# Patient Record
Sex: Male | Born: 1990 | Race: White | Hispanic: No | Marital: Single | Smoking: Never smoker
Health system: Southern US, Community
[De-identification: ages and names within clinical notes are randomized; demographics above are authoritative.]

## PROBLEM LIST (undated history)

## (undated) DIAGNOSIS — M4300 Spondylolysis, site unspecified: Secondary | ICD-10-CM

## (undated) HISTORY — PX: WISDOM TOOTH EXTRACTION: SHX21

## (undated) HISTORY — DX: Spondylolysis, site unspecified: M43.00

---
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
## 2009-08-26 ENCOUNTER — Encounter: Admission: RE | Admit: 2009-08-26 | Discharge: 2009-08-26 | Payer: Self-pay | Admitting: Family Medicine

## 2010-04-25 ENCOUNTER — Encounter: Admission: RE | Admit: 2010-04-25 | Discharge: 2010-04-25 | Payer: Self-pay | Admitting: Family Medicine

## 2011-02-26 ENCOUNTER — Other Ambulatory Visit: Payer: Self-pay | Admitting: Family Medicine

## 2011-02-26 ENCOUNTER — Ambulatory Visit
Admission: RE | Admit: 2011-02-26 | Discharge: 2011-02-26 | Disposition: A | Discharge status: Home / Self Care | Payer: 59 | Source: Ambulatory Visit | Attending: Family Medicine | Admitting: Family Medicine

## 2011-02-26 DIAGNOSIS — M545 Low back pain: Secondary | ICD-10-CM

## 2011-03-05 ENCOUNTER — Ambulatory Visit: Payer: 59 | Attending: Family Medicine

## 2011-03-05 DIAGNOSIS — M545 Low back pain, unspecified: Secondary | ICD-10-CM | POA: Insufficient documentation

## 2011-03-05 DIAGNOSIS — IMO0001 Reserved for inherently not codable concepts without codable children: Secondary | ICD-10-CM | POA: Insufficient documentation

## 2011-03-05 DIAGNOSIS — R5381 Other malaise: Secondary | ICD-10-CM | POA: Insufficient documentation

## 2011-03-05 DIAGNOSIS — M546 Pain in thoracic spine: Secondary | ICD-10-CM | POA: Insufficient documentation

## 2011-03-10 ENCOUNTER — Ambulatory Visit: Payer: 59 | Admitting: Physical Therapy

## 2011-03-12 ENCOUNTER — Ambulatory Visit: Payer: 59 | Admitting: Physical Therapy

## 2011-03-17 ENCOUNTER — Ambulatory Visit: Payer: 59 | Attending: Family Medicine | Admitting: Physical Therapy

## 2011-03-17 DIAGNOSIS — R5381 Other malaise: Secondary | ICD-10-CM | POA: Insufficient documentation

## 2011-03-17 DIAGNOSIS — M545 Low back pain, unspecified: Secondary | ICD-10-CM | POA: Insufficient documentation

## 2011-03-17 DIAGNOSIS — M546 Pain in thoracic spine: Secondary | ICD-10-CM | POA: Insufficient documentation

## 2011-03-17 DIAGNOSIS — IMO0001 Reserved for inherently not codable concepts without codable children: Secondary | ICD-10-CM | POA: Insufficient documentation

## 2011-03-19 ENCOUNTER — Ambulatory Visit: Payer: 59

## 2011-03-24 ENCOUNTER — Encounter: Payer: 59 | Admitting: Physical Therapy

## 2011-03-26 ENCOUNTER — Encounter: Payer: 59 | Admitting: Physical Therapy

## 2011-03-31 ENCOUNTER — Encounter: Payer: 59 | Admitting: Physical Therapy

## 2011-04-02 ENCOUNTER — Encounter: Payer: 59 | Admitting: Physical Therapy

## 2013-06-12 ENCOUNTER — Ambulatory Visit
Admission: RE | Admit: 2013-06-12 | Discharge: 2013-06-12 | Disposition: A | Discharge status: Home / Self Care | Payer: 59 | Source: Ambulatory Visit | Attending: Family Medicine | Admitting: Family Medicine

## 2013-06-12 ENCOUNTER — Other Ambulatory Visit: Payer: Self-pay | Admitting: Family Medicine

## 2013-06-12 DIAGNOSIS — T1490XA Injury, unspecified, initial encounter: Secondary | ICD-10-CM

## 2013-06-12 DIAGNOSIS — M25561 Pain in right knee: Secondary | ICD-10-CM

## 2013-06-29 ENCOUNTER — Ambulatory Visit: Payer: 59 | Attending: Family Medicine

## 2013-06-29 DIAGNOSIS — R262 Difficulty in walking, not elsewhere classified: Secondary | ICD-10-CM | POA: Insufficient documentation

## 2013-06-29 DIAGNOSIS — IMO0001 Reserved for inherently not codable concepts without codable children: Secondary | ICD-10-CM | POA: Insufficient documentation

## 2013-06-29 DIAGNOSIS — R5381 Other malaise: Secondary | ICD-10-CM | POA: Insufficient documentation

## 2013-06-29 DIAGNOSIS — M25569 Pain in unspecified knee: Secondary | ICD-10-CM | POA: Insufficient documentation

## 2013-07-04 ENCOUNTER — Ambulatory Visit: Payer: 59

## 2013-07-06 ENCOUNTER — Ambulatory Visit: Payer: 59 | Admitting: Physical Therapy

## 2013-07-12 ENCOUNTER — Ambulatory Visit: Payer: 59 | Admitting: Physical Therapy

## 2014-01-04 ENCOUNTER — Encounter: Payer: Self-pay | Admitting: Neurology

## 2014-01-08 ENCOUNTER — Encounter: Payer: Self-pay | Admitting: Neurology

## 2014-01-08 ENCOUNTER — Ambulatory Visit (INDEPENDENT_AMBULATORY_CARE_PROVIDER_SITE_OTHER): Payer: 59 | Admitting: Neurology

## 2014-01-08 ENCOUNTER — Telehealth: Payer: Self-pay | Admitting: Neurology

## 2014-01-08 VITALS — BP 131/83 | HR 85 | Ht 74.0 in | Wt 164.0 lb

## 2014-01-08 DIAGNOSIS — M79609 Pain in unspecified limb: Secondary | ICD-10-CM | POA: Insufficient documentation

## 2014-01-08 DIAGNOSIS — M79643 Pain in unspecified hand: Secondary | ICD-10-CM

## 2014-01-08 NOTE — Telephone Encounter (Signed)
I received the report from gross per orthopedics concerning the MRI of the cervical spine. This shows a small disc protrusions at C3-4 level without cord compression or nerve root compression. This may be a source of the neck pain, I would doubt that this has anything to do with his hand pain. Blood work is pending, if this is unremarkable, we will pursue EMG and nerve conduction study evaluation.

## 2014-01-08 NOTE — Progress Notes (Signed)
Reason for visit: Hand pain  Nicolas Patton is a 23 y.o. male  History of present illness:  Nicolas Patton is a 23 year old right-handed white male with a history of onset of wrist discomfort that began in February 2015. The patient initially noted onset of left wrist pain in the ulnar distribution, spreading to the base of the thumb on the left, and eventually to the right side. The patient indicates that the discomfort occurs after working all day. The patient has been placed in a brace, given a trial on prednisone, and placed on diclofenac without benefit. The patient mainly has some discomfort with extension of the thumb. The patient denies any radiation of pain to the elbow or shoulder, but within the last 6 weeks he has developed some neck discomfort that may radiate into the shoulders. The patient denies any weakness of the arms, and he denies any numbness or tingling sensations. He denies any back pain or leg discomfort or leg numbness or weakness. He denies any balance issues or problems controlling the bowels or the bladder. MRI of the cervical spine has been done, the results of this study are not available to me. The patient himself is unaware of the results. He is sent to this office for an evaluation.  Past Medical History  Diagnosis Date  . Pars defect     Past Surgical History  Procedure Laterality Date  . Wisdom tooth extraction      Family History  Problem Relation Age of Onset  . Depression Father   . Alzheimer's disease Maternal Grandmother     Social history:  reports that he has never smoked. He has never used smokeless tobacco. He reports that he drinks alcohol. He reports that he does not use illicit drugs.  Medications:  Current Outpatient Prescriptions on File Prior to Visit  Medication Sig Dispense Refill  . naproxen sodium (ANAPROX) 220 MG tablet Take 220 mg by mouth 2 (two) times daily with a meal.      . diclofenac (VOLTAREN) 75 MG EC tablet Take 75 mg by  mouth 2 (two) times daily.       No current facility-administered medications on file prior to visit.     No Known Allergies  ROS:  Out of a complete 14 system review of symptoms, the patient complains only of the following symptoms, and all other reviewed systems are negative.  Chest pain Joint pain  Blood pressure 131/83, pulse 85, height 6\' 2"  (1.88 m), weight 164 lb (74.39 kg).  Physical Exam  General: The patient is alert and cooperative at the time of the examination.  Eyes: Pupils are equal, round, and reactive to light. Discs are flat bilaterally.  Neck: The neck is supple, no carotid bruits are noted.  Respiratory: The respiratory examination is clear.  Cardiovascular: The cardiovascular examination reveals a regular rate and rhythm, no obvious murmurs or rubs are noted.  Neuromuscular: Range of movement of cervical spine is full.  Skin: Extremities are without significant edema.  Neurologic Exam  Mental status: The patient is alert and oriented x 3 at the time of the examination. The patient has apparent normal recent and remote memory, with an apparently normal attention span and concentration ability.  Cranial nerves: Facial symmetry is present. There is good sensation of the face to pinprick and soft touch bilaterally. The strength of the facial muscles and the muscles to head turning and shoulder shrug are normal bilaterally. Speech is well enunciated, no aphasia or dysarthria  is noted. Extraocular movements are full. Visual fields are full. The tongue is midline, and the patient has symmetric elevation of the soft palate. No obvious hearing deficits are noted.  Motor: The motor testing reveals 5 over 5 strength of all 4 extremities. Good symmetric motor tone is noted throughout.  Sensory: Sensory testing is intact to pinprick, soft touch, vibration sensation, and position sense on all 4 extremities. No evidence of extinction is noted. Tinel sign is negative at  the wrists bilaterally.  Coordination: Cerebellar testing reveals good finger-nose-finger and heel-to-shin bilaterally.  Gait and station: Gait is normal. Tandem gait is normal. Romberg is negative. No drift is seen.  Reflexes: Deep tendon reflexes are symmetric and normal bilaterally. Toes are downgoing bilaterally.   Assessment/Plan:  1. Bilateral wrist discomfort  The history and clinical examination appears to be consistent with a tendinitis issue as originally suspected. The patient has developed some neuromuscular discomfort in the neck. There are no radicular symptoms. The patient will be set up for blood work today looking for a rheumatologic etiology of his symptoms. We may consider nerve conduction studies of both arms, EMG of at least 1 extremity in the future. We will obtain the results of the MRI of the cervical spine that was done.  Marlan Palau MD 01/08/2014 12:48 PM  [facility] [address] [place], [place] [address]  Phone [phone] Fax [phone]

## 2014-01-09 ENCOUNTER — Telehealth: Payer: Self-pay | Admitting: Neurology

## 2014-01-09 DIAGNOSIS — M79643 Pain in unspecified hand: Secondary | ICD-10-CM

## 2014-01-09 LAB — SEDIMENTATION RATE: SED RATE: 2 mm/h (ref 0–15)

## 2014-01-09 LAB — RHEUMATOID FACTOR: Rhuematoid fact SerPl-aCnc: <7 IU/mL (ref 0.0–13.9)

## 2014-01-09 LAB — ANGIOTENSIN CONVERTING ENZYME: ANGIO CONVERT ENZYME: 31 U/L (ref 14–82)

## 2014-01-09 LAB — LYME, TOTAL AB TEST/REFLEX: Lyme IgG/IgM Ab: <0.91 {ISR} (ref 0.00–0.90)

## 2014-01-09 LAB — ANA W/REFLEX: Anti Nuclear Antibody(ANA): NEGATIVE

## 2014-01-09 NOTE — Telephone Encounter (Signed)
Blood work is unremarkable, we will consider nerve conduction studies of the hands, EMG of the right arm.

## 2014-02-02 ENCOUNTER — Encounter (INDEPENDENT_AMBULATORY_CARE_PROVIDER_SITE_OTHER): Payer: Self-pay

## 2014-02-02 ENCOUNTER — Ambulatory Visit (INDEPENDENT_AMBULATORY_CARE_PROVIDER_SITE_OTHER): Payer: 59 | Admitting: Neurology

## 2014-02-02 DIAGNOSIS — Z0289 Encounter for other administrative examinations: Secondary | ICD-10-CM

## 2014-02-02 DIAGNOSIS — M79643 Pain in unspecified hand: Secondary | ICD-10-CM

## 2014-02-02 DIAGNOSIS — M79609 Pain in unspecified limb: Secondary | ICD-10-CM

## 2014-02-02 NOTE — Progress Notes (Signed)
Nicolas Patton is a 23 year old patient with a history of left wrist discomfort beginning in February 2015. The patient has had gradual spreading of pain to the right wrist as well. He does have some problems with neck discomfort at times. He is being evaluated for a neuropathy or a cervical radiculopathy.  Nerve conduction studies done on both arms were normal, without evidence of a neuropathy. EMG evaluation of the right upper extremity shows isolated mild denervation of the extensor indicis proprius muscle on the right. The patient is felt potentially to having mild right posterior interosseous neuropathy versus a possible low-grade thoracic outlet syndrome. The left upper extremity evaluation was normal.  The patient will use a wrist splint on the right over the next 6 weeks, he will followup in the next 3 months, if the pain worsens or weakness ensues, further evaluation will be undertaken.

## 2014-02-02 NOTE — Procedures (Signed)
     HISTORY:  Nicolas Patton is a 23 year old patient with a history of onset of left wrist discomfort that began in February 2015, gradually spreading to the right side as well. The does have some problems with neck discomfort, and a history of a "bulging disc" in the neck. The patient is being evaluated for possible neuropathy or a cervical radiculopathy.  NERVE CONDUCTION STUDIES:  Nerve conduction studies were performed on both upper extremities. The distal motor latencies and motor amplitudes for the median and ulnar nerves were within normal limits. The F wave latencies and nerve conduction velocities for these nerves were also normal. The sensory latencies for the median and ulnar nerves were normal.   EMG STUDIES:  EMG study was performed on the right upper extremity:  The first dorsal interosseous muscle reveals 2 to 4 K units with full recruitment. No fibrillations or positive waves were noted. The abductor pollicis brevis muscle reveals 2 to 4 K units with full recruitment. No fibrillations or positive waves were noted. The extensor indicis proprius muscle reveals 1 to 3 K units with full recruitment. One plus fibrillations and positive waves were noted. The pronator teres muscle reveals 2 to 3 K units with full recruitment. No fibrillations or positive waves were noted. The biceps muscle reveals 1 to 2 K units with full recruitment. No fibrillations or positive waves were noted. The triceps muscle reveals 2 to 4 K units with full recruitment. No fibrillations or positive waves were noted. The anterior deltoid muscle reveals 2 to 3 K units with full recruitment. No fibrillations or positive waves were noted. The cervical paraspinal muscles were tested at 2 levels. No abnormalities of insertional activity were seen at either level tested. There was poor relaxation.  EMG study was performed on the left upper extremity:  The first dorsal interosseous muscle reveals 2 to 4 K units with  full recruitment. No fibrillations or positive waves were noted. The abductor pollicis brevis muscle reveals 2 to 4 K units with full recruitment. No fibrillations or positive waves were noted. The extensor indicis proprius muscle reveals 1 to 3 K units with full recruitment. No fibrillations or positive waves were noted. The pronator teres muscle reveals 2 to 3 K units with full recruitment. No fibrillations or positive waves were noted. The biceps muscle reveals 1 to 2 K units with full recruitment. No fibrillations or positive waves were noted. The triceps muscle reveals 2 to 4 K units with full recruitment. No fibrillations or positive waves were noted. The anterior deltoid muscle reveals 2 to 3 K units with full recruitment. No fibrillations or positive waves were noted. The cervical paraspinal muscles were tested at 2 levels. No abnormalities of insertional activity were seen at either level tested. There was good relaxation.   IMPRESSION:  Nerve conduction studies done on both upper extremities were unremarkable, without evidence of a neuropathy. EMG evaluation of the right upper extremity shows isolated mild denervation of the extensor indicis proprius muscle, which is of unclear clinical significance. It is possible that there may be a mild posterior interosseous neuropathy, or a mild thoracic outlet syndrome, but clinical correlation is required. No clear evidence of an overlying right cervical radiculopathy is seen. EMG of the left upper extremity is unremarkable, without evidence of a cervical radiculopathy.   Marlan Palau. Keith Jameson Morrow MD 02/02/2014 1:45 PM  [facility] [address] [place], [place] [id number]  Phone [phone] Fax [phone]

## 2014-10-17 IMAGING — CR DG KNEE COMPLETE 4+V*R*
4 series · 4 of 4 positions shown · non-contrast
Comparison: None.

CLINICAL DATA: Trauma 6 days ago.  Pain with flexion.

EXAM:
RIGHT KNEE - COMPLETE 4+ VIEW

[view not recorded (1 of 4)]
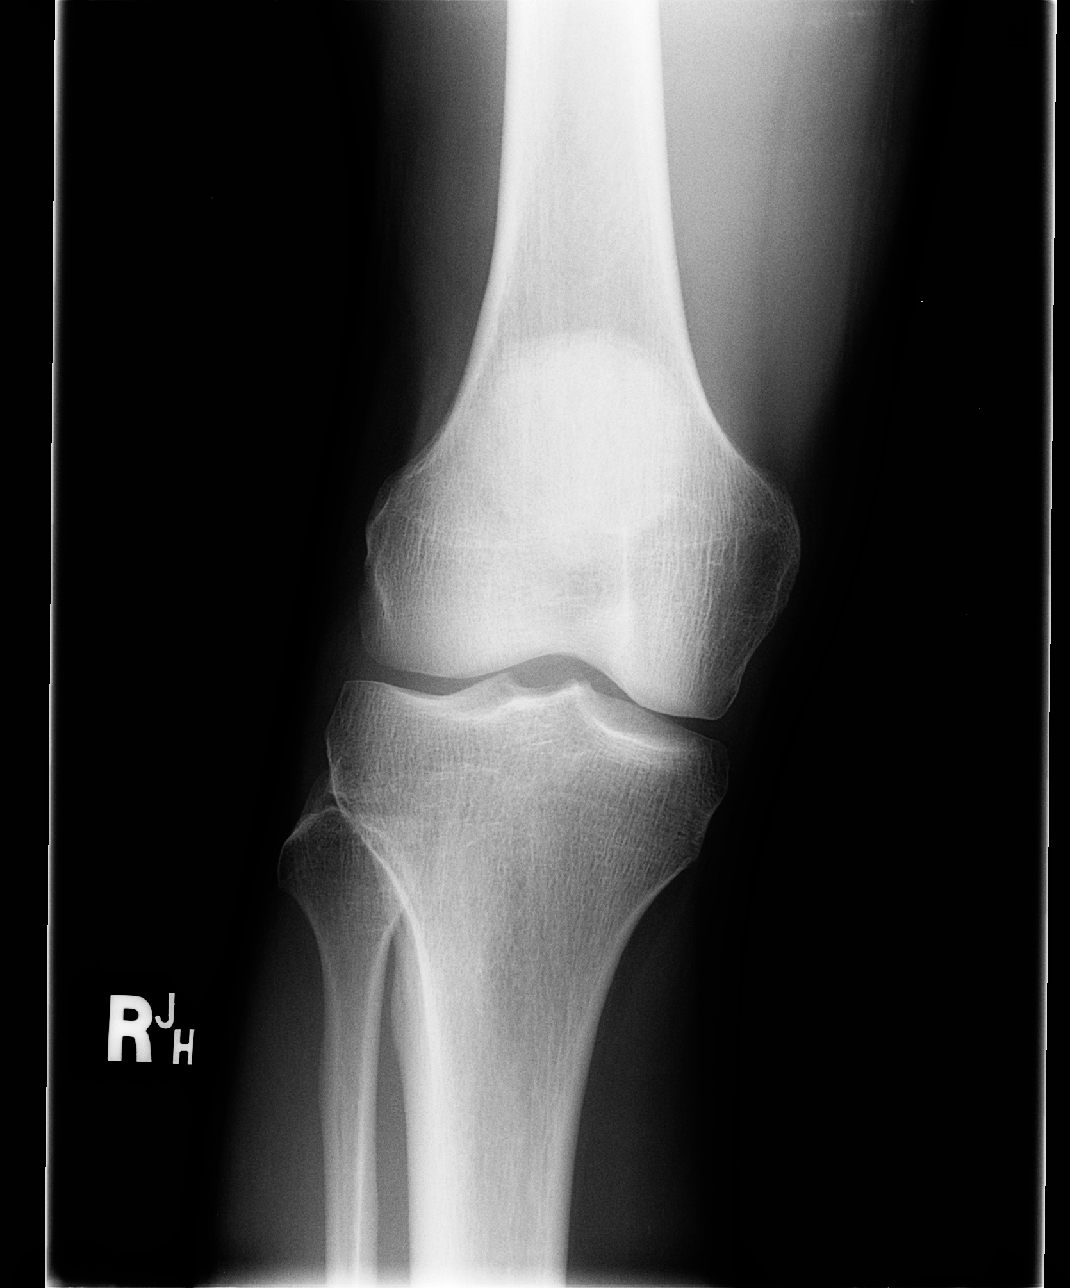

[view not recorded (2 of 4)]
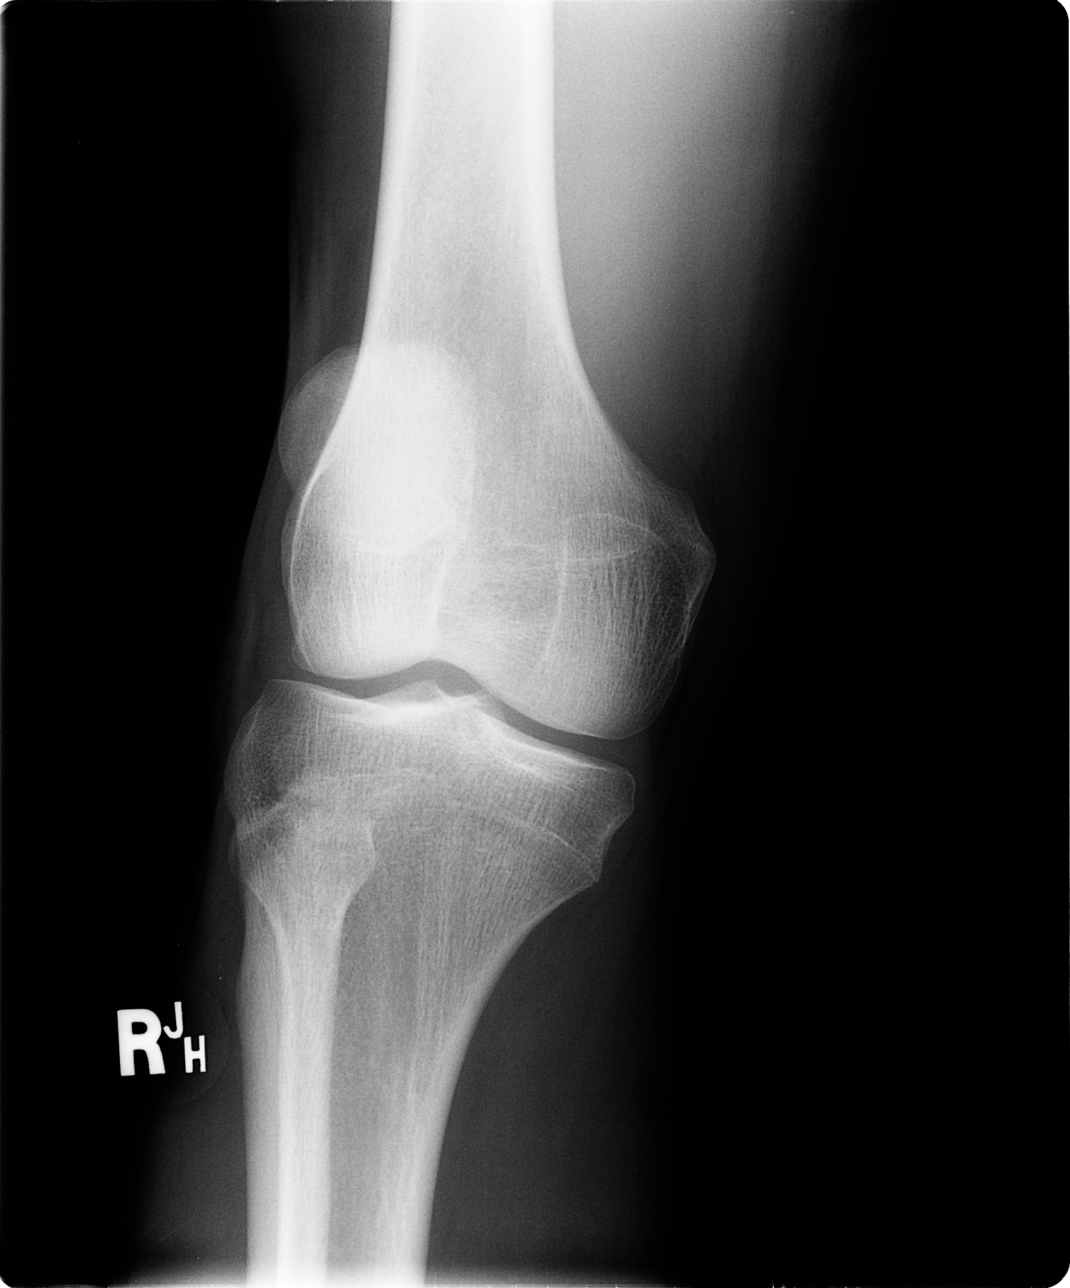

[view not recorded (3 of 4)]
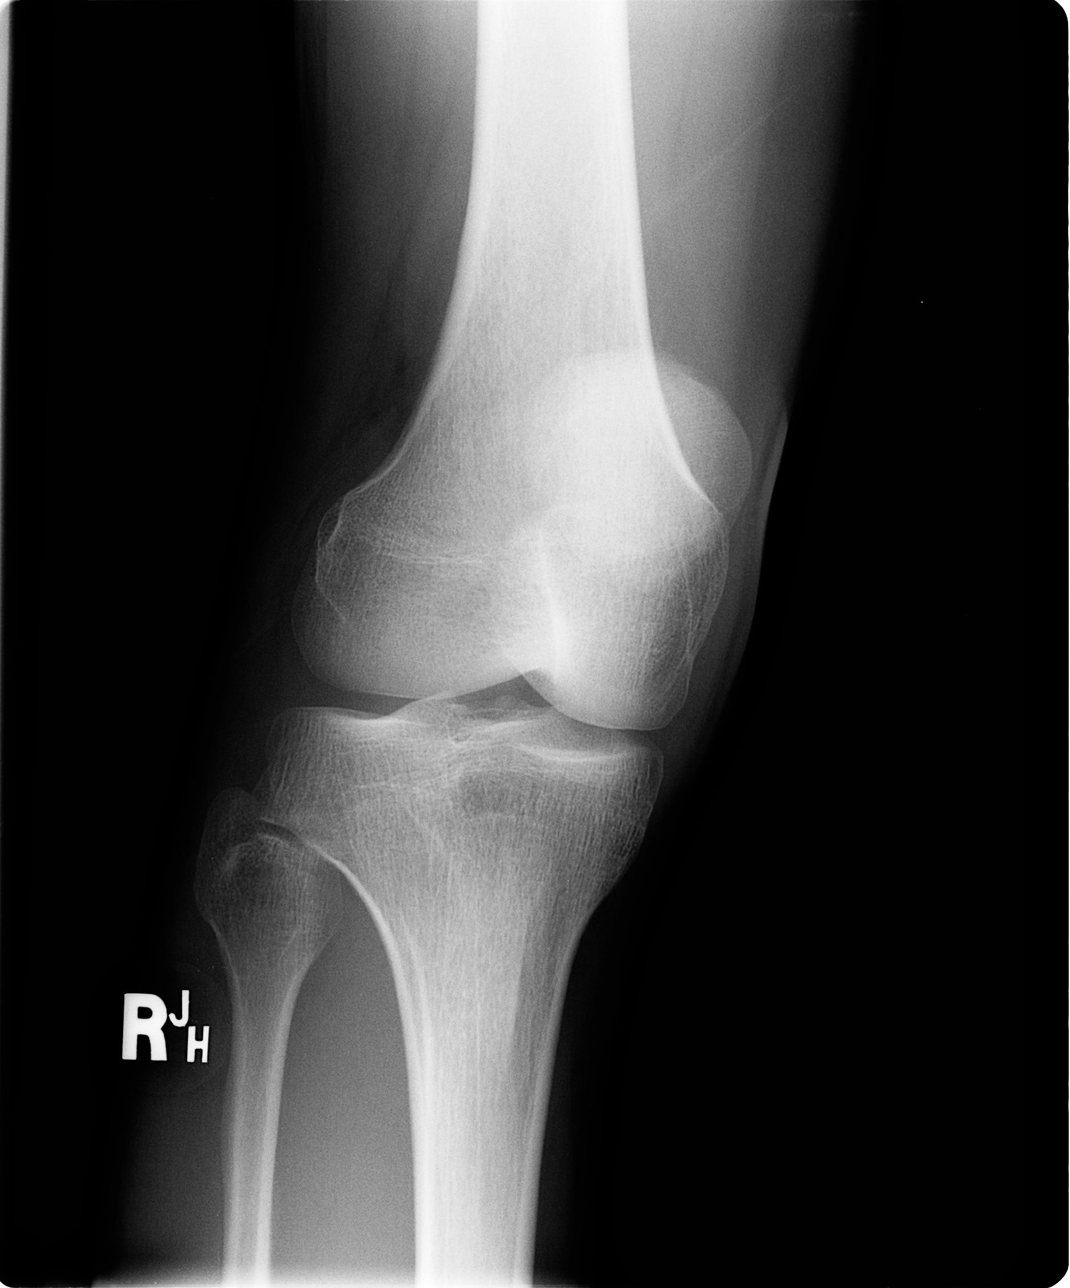

[view not recorded (4 of 4)]
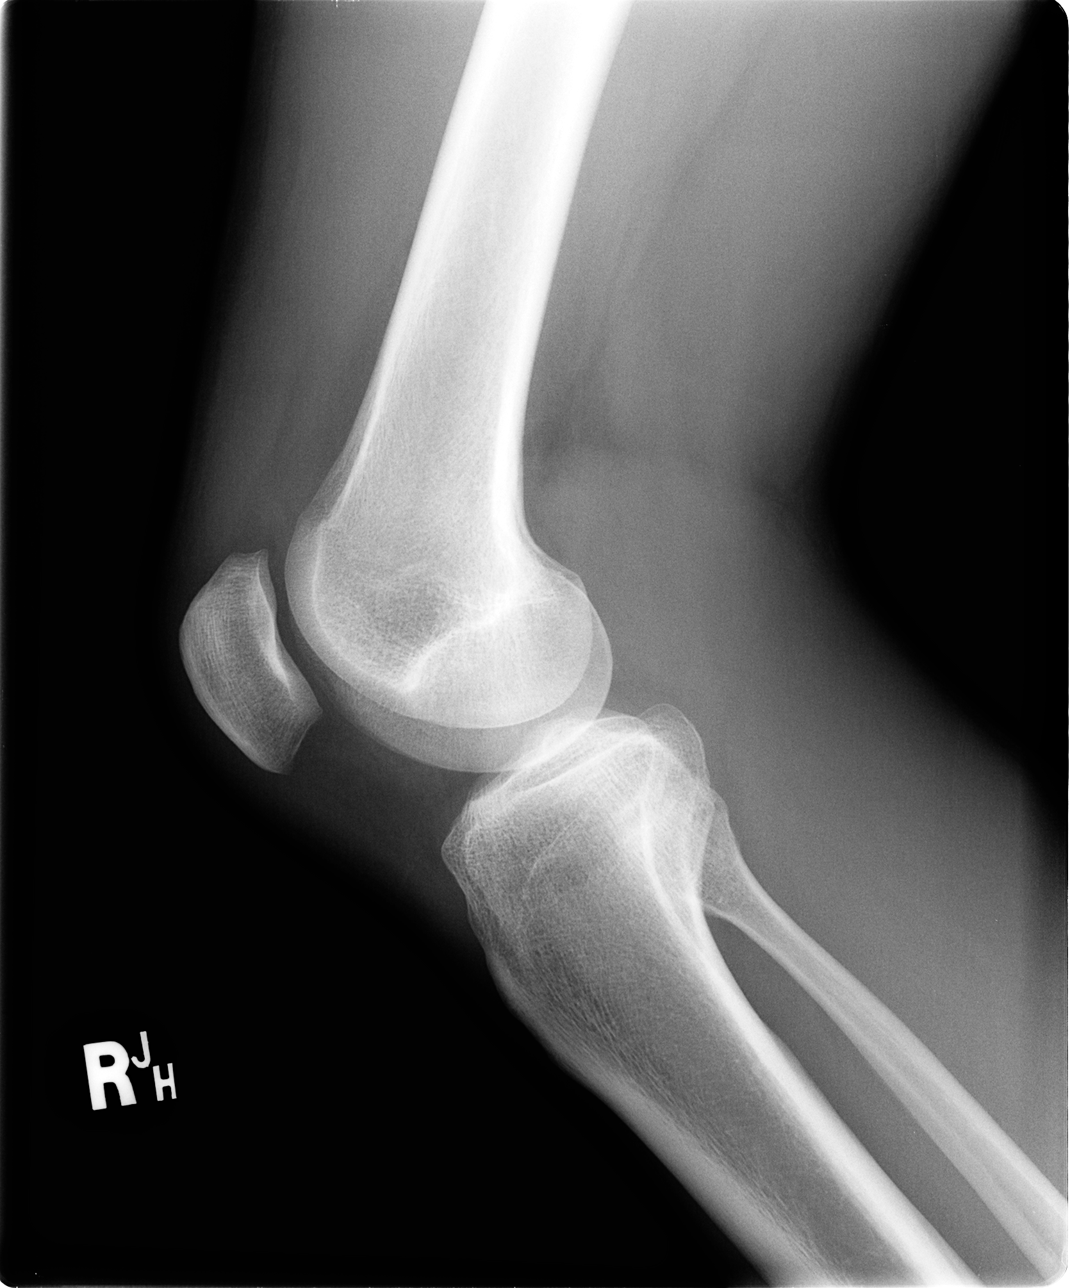

[4 of 4 positions shown; findings below may reference images not displayed]

FINDINGS: There may be a small amount of joint fluid. No evidence of fracture,
degenerative change or other focal lesion.
IMPRESSION: No bone pathology evident. No visible fracture. Possible small
amount of joint fluid.

## 2019-03-21 ENCOUNTER — Other Ambulatory Visit: Payer: Self-pay

## 2019-03-21 DIAGNOSIS — Z20822 Contact with and (suspected) exposure to covid-19: Secondary | ICD-10-CM

## 2019-03-23 LAB — NOVEL CORONAVIRUS, NAA: SARS-CoV-2, NAA: NOT DETECTED

## 2019-03-27 ENCOUNTER — Other Ambulatory Visit: Payer: Self-pay

## 2019-03-27 DIAGNOSIS — Z20822 Contact with and (suspected) exposure to covid-19: Secondary | ICD-10-CM

## 2019-03-28 LAB — NOVEL CORONAVIRUS, NAA: SARS-CoV-2, NAA: NOT DETECTED

## 2019-06-12 ENCOUNTER — Other Ambulatory Visit: Payer: Self-pay

## 2019-06-12 ENCOUNTER — Ambulatory Visit: Payer: Managed Care, Other (non HMO) | Attending: Internal Medicine

## 2019-06-12 DIAGNOSIS — Z20822 Contact with and (suspected) exposure to covid-19: Secondary | ICD-10-CM

## 2019-06-14 LAB — NOVEL CORONAVIRUS, NAA: SARS-CoV-2, NAA: NOT DETECTED

## 2019-06-16 ENCOUNTER — Ambulatory Visit (INDEPENDENT_AMBULATORY_CARE_PROVIDER_SITE_OTHER)
Admission: RE | Admit: 2019-06-16 | Discharge: 2019-06-16 | Disposition: A | Discharge status: Home / Self Care | Payer: Managed Care, Other (non HMO) | Source: Ambulatory Visit

## 2019-06-16 DIAGNOSIS — R0981 Nasal congestion: Secondary | ICD-10-CM

## 2019-06-16 DIAGNOSIS — Z20822 Contact with and (suspected) exposure to covid-19: Secondary | ICD-10-CM

## 2019-06-16 NOTE — Discharge Instructions (Addendum)
You may visit any of our urgent care sites for testing as discussed. For additional information regarding the Covid PCR test, please visit: [email or web address]

## 2019-06-16 NOTE — ED Provider Notes (Signed)
Virtual Visit via Video Note:  Nicolas Patton  initiated request for Telemedicine visit with [facility] team. I connected with Nicolas Patton  on 06/16/2019 at 5:22 PM  for a synchronized telemedicine visit using a video enabled HIPPA compliant telemedicine application. I verified that I am speaking with Nicolas Patton  using two identifiers. Nicolas Hall-Potvin, PA-C  was physically located in a [facility] site and Nicolas Patton was located at a different location.   The limitations of evaluation and management by telemedicine as well as the availability of in-person appointments were discussed. Patient was informed that he  may incur a bill ( including co-pay) for this virtual visit encounter. Nicolas Patton  expressed understanding and gave verbal consent to proceed with virtual visit.     History of Present Illness:Nicolas Patton  is a 29 y.o. male presents with concern regarding Covid exposure.  States him and his girlfriend travel to his girlfriend's family home for the holidays in [place].  Patient had nasal congestion on 12/22 prior to going to being around family members.  Denies fever, cough, shortness of breath at that time.  Patient underwent Covid test on 12/28 when he returned which was negative at that time.  States his girlfriend has is also negative.  Since then, multiple family members have become ill, tested positive within this last week for Covid.  Patient has been monitoring temperature at home: 97.41F today.  Take anything currently for symptoms.  Review of Systems  Constitutional: Negative for fever and malaise/fatigue.  HENT: Positive for congestion. Negative for sinus pain and sore throat.   Eyes: Negative for pain and redness.  Respiratory: Negative for cough and shortness of breath.   Cardiovascular: Negative for chest pain and palpitations.  Gastrointestinal: Negative for abdominal pain, diarrhea and vomiting.  Musculoskeletal: Negative for joint pain and myalgias.     Past Medical History:  Diagnosis Date  . Pars defect     No Known Allergies      Observations/Objective: Due to issues with patient's video, unable to observe patient directly.  Patient voice is of normal quality/tone, without cough, wheezing.  Speaks comfortably.  Assessment and Plan: Patient with Covid exposure who underwent previously negative PCR testing.  Inquiring about repeat PCR testing given now numerous known positives plus upper respiratory symptoms.  Patient to seek in-person testing over the weekend as [facility] is close.  Follow Up Instructions: Patient to seek in person Covid PCR testing given numerous Covid exposures.   I discussed the assessment and treatment plan with the patient. The patient was provided an opportunity to ask questions and all were answered. The patient agreed with the plan and demonstrated an understanding of the instructions.   The patient was advised to call back or seek an in-person evaluation if the symptoms worsen or if the condition fails to improve as anticipated.  I provided 15 minutes of non-face-to-face time during this encounter.    Nicolas Hall-Potvin, PA-C  06/16/2019 5:22 PM        Patton, [place], PA-C 06/16/19 1723

## 2019-06-19 ENCOUNTER — Ambulatory Visit: Payer: Managed Care, Other (non HMO) | Attending: Internal Medicine

## 2019-06-19 ENCOUNTER — Other Ambulatory Visit: Payer: Self-pay

## 2019-06-19 DIAGNOSIS — Z20822 Contact with and (suspected) exposure to covid-19: Secondary | ICD-10-CM

## 2019-06-20 LAB — NOVEL CORONAVIRUS, NAA: SARS-CoV-2, NAA: NOT DETECTED
